# Patient Record
Sex: Female | Born: 1960 | Race: Black or African American | Hispanic: No | Marital: Married | Smoking: Never smoker
Health system: Southern US, Community
[De-identification: ages and names within clinical notes are randomized; demographics above are authoritative.]

## PROBLEM LIST (undated history)

## (undated) DIAGNOSIS — F419 Anxiety disorder, unspecified: Secondary | ICD-10-CM

## (undated) DIAGNOSIS — E119 Type 2 diabetes mellitus without complications: Secondary | ICD-10-CM

## (undated) DIAGNOSIS — I10 Essential (primary) hypertension: Secondary | ICD-10-CM

## (undated) DIAGNOSIS — T7840XA Allergy, unspecified, initial encounter: Secondary | ICD-10-CM

## (undated) HISTORY — DX: Allergy, unspecified, initial encounter: T78.40XA

## (undated) HISTORY — DX: Essential (primary) hypertension: I10

## (undated) HISTORY — PX: TUBAL LIGATION: SHX77

---

## 2004-04-18 ENCOUNTER — Other Ambulatory Visit: Admission: RE | Admit: 2004-04-18 | Discharge: 2004-04-18 | Payer: Self-pay | Admitting: Family Medicine

## 2004-11-19 ENCOUNTER — Encounter (INDEPENDENT_AMBULATORY_CARE_PROVIDER_SITE_OTHER): Payer: Self-pay | Admitting: Specialist

## 2004-11-19 ENCOUNTER — Ambulatory Visit (HOSPITAL_COMMUNITY): Admission: RE | Admit: 2004-11-19 | Discharge: 2004-11-19 | Payer: Self-pay | Admitting: Obstetrics & Gynecology

## 2004-12-23 ENCOUNTER — Ambulatory Visit (HOSPITAL_COMMUNITY): Admission: RE | Admit: 2004-12-23 | Discharge: 2004-12-23 | Payer: Self-pay | Admitting: Obstetrics & Gynecology

## 2006-12-14 ENCOUNTER — Encounter: Admission: RE | Admit: 2006-12-14 | Discharge: 2006-12-14 | Payer: Self-pay | Admitting: Family Medicine

## 2007-10-24 ENCOUNTER — Emergency Department (HOSPITAL_COMMUNITY): Admission: EM | Admit: 2007-10-24 | Discharge: 2007-10-24 | Payer: Self-pay | Admitting: Emergency Medicine

## 2007-11-14 ENCOUNTER — Emergency Department (HOSPITAL_COMMUNITY): Admission: EM | Admit: 2007-11-14 | Discharge: 2007-11-14 | Payer: Self-pay | Admitting: Emergency Medicine

## 2007-11-29 ENCOUNTER — Encounter: Admission: RE | Admit: 2007-11-29 | Discharge: 2007-11-29 | Payer: Self-pay | Admitting: Family Medicine

## 2007-12-28 ENCOUNTER — Encounter: Admission: RE | Admit: 2007-12-28 | Discharge: 2007-12-28 | Payer: Self-pay | Admitting: Family Medicine

## 2011-01-04 ENCOUNTER — Emergency Department (HOSPITAL_COMMUNITY)
Admission: EM | Admit: 2011-01-04 | Discharge: 2011-01-04 | Payer: Self-pay | Source: Home / Self Care | Admitting: Emergency Medicine

## 2011-04-24 NOTE — Op Note (Signed)
NAMEALANNI, VADER                 ACCOUNT NO.:  [id number]   MEDICAL RECORD NO.:  [id number]          PATIENT TYPE:  AMB   LOCATION:  SDC                           FACILITY:  [facility]   PHYSICIAN:  Ilda Mori, M.D.   DATE OF BIRTH:  05-Aug-1961   DATE OF PROCEDURE:  12/23/2004  DATE OF DISCHARGE:                                 OPERATIVE REPORT   PREOPERATIVE DIAGNOSIS:  Voluntary sterilization.   POSTOPERATIVE DIAGNOSIS:  Voluntary sterilization, pelvic adhesions.   PROCEDURE:  Laparoscopic bilateral tubal cautery with sterilization.   SURGEON:  Ilda Mori, M.D.   ANESTHESIA:  General endotracheal.   ESTIMATED BLOOD LOSS:  Minimal.   FINDINGS:  There were thick adhesions from the fundus to the anterior  abdominal wall of the uterus.  There were omental adhesions around the  umbilical area and along the anterior abdominal wall.  The fallopian tubes  appeared normal.  The ovaries appeared normal.  The cul-de-sac was free of  endometriosis.   INDICATION:  This is a 50 year old gravida 4, para 3, AB 1, who requests  permanent sterilization.  Options were discussed with the patient including  IUD use and hormonal contraceptives.  The patient elected to proceed with  tubal sterilization.   PROCEDURE:  The patient was taken to the operating room and placed in the  supine position and general endotracheal anesthesia was induced.  She was  then placed in the modified dorsal lithotomy position.  The abdomen and  vagina and perineum were prepped and draped in a sterile fashion.  A Hulka  tenaculum was introduced through the endocervical canal and affixed to the  anterior lip of the cervix.  The surgeon re-gowned and gloved.  An incision  was made at the base of the umbilicus and a Veress needle was introduced  into the peritoneal cavity and pneumoperitoneum was created.  A 5-mm trocar  was introduced into the pneumoperitoneum and under direct visualization, an  accessory probe was  placed slightly right of midline in the suprapubic area  avoiding a uterine adhesion which went to the anterior abdominal wall.  The  adnexa were viewed with some difficulty due to omental adhesions and pockets  through which the scope had to be placed in order to visualize the adnexal  structures.  Finally they were both clearly visualized.  The fimbriated ends  were noted and the ovaries were noted to be present and normal bilaterally.  A Kleppinger forceps was introduced and grasped the left fallopian tube.  This tube was cauterized along a 3-4 cm length at the infundibular junction  until no current was flowing through the tissue.  An identical procedure was  then carried out on the contralateral tube.  At the end of the procedure,  both tubes were  cauterized along a 4-cm length leaving a centimeter of normal tube proximal  to the burn site.  The procedure was then terminated.  The gas was allowed  to escape.  The incisions were closed with Dermabond.  The patient left the  operating room in good condition.  RK/MEDQ  D:  12/23/2004  T:  12/23/2004  Job:  [id number]

## 2011-04-24 NOTE — Op Note (Signed)
Bonnie Webster, Bonnie Webster                 ACCOUNT NO.:  [id number]   MEDICAL RECORD NO.:  [id number]          PATIENT TYPE:  AMB   LOCATION:  SDC                           FACILITY:  [facility]   PHYSICIAN:  Ilda Mori, M.D.   DATE OF BIRTH:  08-23-1961   DATE OF PROCEDURE:  11/19/2004  DATE OF DISCHARGE:                                 OPERATIVE REPORT   PREOPERATIVE DIAGNOSES:  Unplanned pregnancy, hypertension, advanced  maternal age.   POSTOPERATIVE DIAGNOSES:  Unplanned pregnancy, hypertension, advanced  maternal age.   PROCEDURE:  Therapeutic abortion, dilatation and evacuation.   SURGEON:  Ilda Mori, M.D.   ANESTHESIA:  Paracervical block with IV sedation.   ESTIMATED BLOOD LOSS:  20 mL.   FINDINGS:  Products of conception consistent with an [redacted] week gestation.   INDICATIONS FOR PROCEDURE:  This is a 50 year old, gravida 3, para 3 who  presented to the office with an unplanned pregnancy.  The patient requested  to know her options and the risk of carrying a pregnancy at 41 weeks with a  history of chronic hypertension and type 2 diabetes.  The risks were  discussed with the patient who elected to terminate the pregnancy.   DESCRIPTION OF PROCEDURE:  The patient was taken to the operating room and  IV sedation was administered. She was placed in the dorsal lithotomy  position and the perineum and vagina were prepped and draped in a sterile  fashion.  Evaluation under anesthesia revealed an anterior 10-12 week size  uterus.  The speculum was placed in the vagina, the anterior lip of the  cervix was grasped with a single tooth tenaculum, 20 mL of 1% lidocaine was  infiltrated in the paracervical tissues.  The internal os was dilated with  Shawnie Pons dilators to a #35 [place], a #12 suction curette was then introduced  into the endometrial cavity and the products of conception were aspirated.  Following this, a #9 suction curette was introduced and a suction D&C was  performed to  ensure that the entire uterine contents had been emptied. The  procedure was then terminated and the patient left the operating room in  good condition.      RK/MEDQ  D:  11/19/2004  T:  11/19/2004  Job:  [id number]

## 2011-09-14 LAB — DIFFERENTIAL
Basophils Absolute: 0.1
Eosinophils Absolute: 0 — ABNORMAL LOW
Eosinophils Relative: 0
Lymphocytes Relative: 18
Lymphs Abs: 1.8
Monocytes Absolute: 0.3
Monocytes Relative: 3
Neutrophils Relative %: 79 — ABNORMAL HIGH

## 2011-09-14 LAB — COMPREHENSIVE METABOLIC PANEL
BUN: 10
Calcium: 9.5
Glucose, Bld: 153 — ABNORMAL HIGH
Total Bilirubin: 1.2
Total Protein: 7

## 2011-09-14 LAB — CBC
HCT: 45.6
MCHC: 34
Platelets: 304
RDW: 13

## 2011-09-14 LAB — URINALYSIS, ROUTINE W REFLEX MICROSCOPIC
Protein, ur: NEGATIVE
Specific Gravity, Urine: 1.029
Urobilinogen, UA: 1
pH: 5.5

## 2013-05-05 ENCOUNTER — Ambulatory Visit: Payer: 59

## 2013-05-05 ENCOUNTER — Ambulatory Visit (INDEPENDENT_AMBULATORY_CARE_PROVIDER_SITE_OTHER): Payer: 59 | Admitting: Family Medicine

## 2013-05-05 VITALS — BP 130/90 | HR 61 | Temp 97.9°F | Resp 16 | Ht 67.0 in | Wt 228.0 lb

## 2013-05-05 DIAGNOSIS — M542 Cervicalgia: Secondary | ICD-10-CM

## 2013-05-05 DIAGNOSIS — S0990XA Unspecified injury of head, initial encounter: Secondary | ICD-10-CM

## 2013-05-05 DIAGNOSIS — S0993XA Unspecified injury of face, initial encounter: Secondary | ICD-10-CM
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
## 2013-05-05 NOTE — Patient Instructions (Signed)
Concussion and Brain Injury  A blow to the head can stop the brain from working normally (concussion). It is usually not life-threatening. However, the results of the injury can be serious. Problems caused by the injury might show up right away or days or weeks later. Getting better might take some time.  HOME CARE   Rest your body. Ways to rest your body include:   Getting plenty of sleep at night.   Going to sleep early.   Taking naps during the day when you feel tired.   Limit activities that require a lot of thought. This includes:   Time spent with homework.   Time spent with work related to a job.   TV watching.   Computer use.   Return to normal activities (driving, work, school) only when your doctor says it is okay.   Avoid high impact activity and sports until your doctor says it is okay.   Take medicines only as told by your doctor.   Do not drink alcohol until your doctor says it is okay.   Do not make important decisions without help until you feel better.   Follow up with your doctor as told.  GET HELP RIGHT AWAY IF:   You, your family, or your friends notice that:   You have bad headaches, or they get worse.   You have weakness, loss of feeling (numbness), or you feel off balance.   You keep throwing up (vomiting).   You feel tired or pass out (faint).   One black center of your eye (pupil) is larger than the other.   You twitch or shake (seize).   Your speech is not clear (slurred).   You are confused, restless, easily angered (agitated), or annoyed (irritable).   You cannot recognize or respond to people or activities.   You have neck pain.   You have trouble being woken up.   Your behavior changes.  MAKE SURE YOU:    Understand these instructions.   Will watch your condition.   Will get help right away if you are not doing well or get worse.  Document Released: 11/11/2009 Document Revised: 02/15/2012 Document Reviewed: 11/11/2009  Exit[employer] Patient Information 2014  [employer].
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
## 2015-06-29 ENCOUNTER — Encounter (HOSPITAL_COMMUNITY): Payer: Self-pay | Admitting: Nurse Practitioner

## 2015-06-29 ENCOUNTER — Emergency Department (HOSPITAL_COMMUNITY)
Admission: EM | Admit: 2015-06-29 | Discharge: 2015-06-29 | Disposition: A | Discharge status: Home / Self Care | Payer: BLUE CROSS/BLUE SHIELD | Attending: Emergency Medicine | Admitting: Emergency Medicine

## 2015-06-29 DIAGNOSIS — Y939 Activity, unspecified: Secondary | ICD-10-CM | POA: Diagnosis not present

## 2015-06-29 DIAGNOSIS — X58XXXA Exposure to other specified factors, initial encounter: Secondary | ICD-10-CM | POA: Diagnosis not present

## 2015-06-29 DIAGNOSIS — Z79899 Other long term (current) drug therapy: Secondary | ICD-10-CM | POA: Insufficient documentation

## 2015-06-29 DIAGNOSIS — I10 Essential (primary) hypertension: Secondary | ICD-10-CM | POA: Insufficient documentation

## 2015-06-29 DIAGNOSIS — Y929 Unspecified place or not applicable: Secondary | ICD-10-CM | POA: Insufficient documentation

## 2015-06-29 DIAGNOSIS — Y998 Other external cause status: Secondary | ICD-10-CM | POA: Insufficient documentation

## 2015-06-29 DIAGNOSIS — T1592XA Foreign body on external eye, part unspecified, left eye, initial encounter: Secondary | ICD-10-CM | POA: Insufficient documentation

## 2015-06-29 MED ORDER — TETRACAINE HCL 0.5 % OP SOLN
2.0000 [drp] | Freq: Once | OPHTHALMIC | Status: AC
  Administered 2015-06-29: 2 [drp] via OPHTHALMIC
  Filled 2015-06-29: qty 2

## 2015-06-29 MED ORDER — FLUORESCEIN SODIUM 1 MG OP STRP
1.0000 | ORAL_STRIP | Freq: Once | OPHTHALMIC | Status: AC
  Administered 2015-06-29: 1 via OPHTHALMIC
  Filled 2015-06-29: qty 1
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
## 2015-06-29 NOTE — ED Notes (Signed)
Pt is in stable condition upon d/c and ambulates from ED. 

## 2015-06-29 NOTE — ED Provider Notes (Signed)
CSN: [id number]     Arrival date & time 06/29/15  1240 History  This chart was scribed for non-physician practitioner, Marlon Pel, PA-C, working with Gwyneth Sprout, MD, by Ronney Lion, ED Scribe. This patient was seen in room TR04C/TR04C and the patient's care was started at 1:04 PM.    Chief Complaint  Patient presents with  . Eye Problem   The history is provided by the patient. No language interpreter was used.    HPI Comments: Bonnie Webster is a 54 y.o. female who presents to the Emergency Department complaining of left eye redness and irritation that began 1 hour ago, which she suspects may be a contact lens or part of a contact lens stuck in her eye. Patient states she uses daily lenses and had taken both contact lenses out of her eyes last. She then put in both contacts this morning but noticed the vision in her left eye was still blurry, so she thought the lens had popped out onto the floor. She then took both contact lenses out and wore glasses for the rest of the day, when 1 hour ago she began to feel sudden onset irritation.    Past Medical History  Diagnosis Date  . Allergy   . Hypertension    Past Surgical History  Procedure Laterality Date  . Cesarean section    . Tubal ligation     History reviewed. No pertinent family history. History  Substance Use Topics  . Smoking status: Never Smoker   . Smokeless tobacco: Not on file  . Alcohol Use: No   OB History    No data available     Review of Systems  Eyes: Positive for pain and redness.  A complete 10 system review of systems was obtained and all systems are negative except as noted in the HPI and PMH.    Allergies  Vicodin  Home Medications   Prior to Admission medications   Medication Sig Start Date End Date Taking? Authorizing Provider  atenolol-chlorthalidone (TENORETIC) 50-25 MG per tablet Take 1 tablet by mouth daily.    Historical Provider, MD  olmesartan (BENICAR) 20 MG tablet Take 20 mg by mouth  daily.    Historical Provider, MD   BP 146/102 mmHg  Pulse 89  Temp(Src) 98 F (36.7 C) (Oral)  Ht  (1.676 m)  Wt 205 lb (92.987 kg)  BMI 33.10 kg/m2  SpO2 100% Physical Exam  Constitutional: She is oriented to person, place, and time. She appears well-developed and well-nourished. No distress.  HENT:  Head: Normocephalic and atraumatic.  Eyes: EOM are normal. Pupils are equal, round, and reactive to light. Foreign body present in the left eye. Left conjunctiva is injected. Left conjunctiva has no hemorrhage.    Neck: Neck supple. No tracheal deviation present.  Cardiovascular: Normal rate.   Pulmonary/Chest: Effort normal. No respiratory distress.  Musculoskeletal: Normal range of motion.  Neurological: She is alert and oriented to person, place, and time.  Skin: Skin is warm and dry.  Psychiatric: She has a normal mood and affect. Her behavior is normal.  Nursing note and vitals reviewed.   ED Course  Procedures (including critical care time)  DIAGNOSTIC STUDIES: Oxygen Saturation is 100% on RA, normal by my interpretation.    COORDINATION OF CARE: 1:13 PM - Fluorescein exam shows almost-intact contact lens on patient's left eye, which was removed by me without difficulty.   MDM   Final diagnoses:  Foreign body, eye, left, initial encounter  No corneal abrasion on slit lamp exam. Contact removed Pt is going to her eye doctor today to discuss her concerns with her daily contacts.  Medications  tetracaine (PONTOCAINE) 0.5 % ophthalmic solution 2 drop (2 drops Left Eye Given 06/29/15 1301)  fluorescein ophthalmic strip 1 strip (1 strip Left Eye Given 06/29/15 1301)    53 y.o.Bonnie Webster's evaluation in the Emergency Department is complete. It has been determined that no acute conditions requiring further emergency intervention are present at this time. The patient/guardian have been advised of the diagnosis and plan. We have discussed signs and symptoms that  warrant return to the ED, such as changes or worsening in symptoms.  Vital signs are stable at discharge. Filed Vitals:   06/29/15 1253  BP: 146/102  Pulse: 89  Temp: 98 F (36.7 C)    Patient/guardian has voiced understanding and agreed to follow-up with the PCP or specialist.   I personally performed the services described in this documentation, which was scribed in my presence. The recorded information has been reviewed and is accurate.     Marlon Pel, PA-C 06/29/15 1320  Gwyneth Sprout, MD 06/29/15 1624

## 2015-06-29 NOTE — Discharge Instructions (Signed)
Eye, Foreign Body The term foreign body refers to any object near, on the surface of or in the eye that should not be there. A foreign body may be a small speck of dirt or dust, a hair or eyelash, a splinter or any object. CAUSES  Foreign bodies can get in the eye by:  Flying pieces of something that was broken or destroyed (debris).  A sudden injury (trauma) to the eye. SYMPTOMS  Symptoms depend on what the foreign body is and where it is in the eye. The most common locations are:  On the inner surface of the upper or lower eyelids or on the covering of the white part of the eye (conjunctiva). Symptoms in this location are:  Irritating and painful, especially when blinking.  Feeling like something is in the eye.  On the surface of the clear covering on the front of the eye (cornea). A corneal foreign body has symptoms that:  Are painful and irritating since the cornea is very sensitive.  Form small "rust rings" around a metallic foreign body. Metallic foreign bodies stick more firmly to the surface of the cornea.  Inside the eyeball. Infection can happen fast and can be hard to treat with antibiotics. This is an extremely dangerous situation. Foreign bodies inside the eye can threaten vision. A person may even loose their eye. Foreign bodies inside the eye may cause:  Great pain.  Immediate loss of vision. DIAGNOSIS  Foreign bodies are found during an exam by an eye specialist. Those that are on the eyelids, conjunctiva or cornea are usually (but not always) easily found. When a foreign body is inside the eyeball, a cataract may form almost right away. This makes it hard for an ophthalmologist to find the foreign body. Special tests may be needed, including ultrasound testing, X-rays and CT scans. TREATMENT   Foreign bodies that are on the eyelids, conjunctiva or cornea are often removed easily and painlessly.  If the foreign body has caused a scratch or abrasion of the cornea,  antibiotic drops, ointments and/or a tight patch called a "pressure patch" may be needed. Follow-up exams will be needed for several days until the abrasion heals.  Surgery is needed right away if the foreign body is inside the eyeball. This is a medical emergency. An antibiotic therapy will likely be given to stop an infection. HOME CARE INSTRUCTIONS  The use of eye patches is not universal. Their use varies from state to state and from caregiver to caregiver. If an eye patch was applied:  Keep the eye patch on for as long as directed by your caregiver until the follow-up appointment.  Do not remove the patch to put in medications unless instructed to do so. When replacing the patch, retape it as it was before. Follow the same procedure if the patch becomes loose.  WARNING: Do not drive or operate machinery while the eye is patched. The ability to judge distances will be impaired.  Only take over-the-counter or prescription medicines for pain, discomfort or fever as directed by the caregiver. If no eye patch was applied:  Keep the eye closed as much as possible. Do not rub the eye.  Wear dark glasses as needed to protect the eyes from bright light.  Do not wear contact lenses until the eye feels normal again, or as instructed.  Wear protective eye covering if there is a risk of eye injury. This is important when working with high speed tools.  Only take over-the-counter or   prescription medicines for pain, discomfort or fever as directed by the caregiver. SEEK IMMEDIATE MEDICAL CARE IF:   Pain increases in the eye or the vision changes.  You or your child has problems with the eye patch.  The injury to the eye appears to be getting larger.  There is discharge from the injured eye.  Swelling and/or soreness (inflammation) develops around the affected eye.  You or your child has an oral temperature above 102 F (38.9 C), not controlled by medicine.  Your baby is older than 3  months with a rectal temperature of 102 F (38.9 C) or higher.  Your baby is 3 months old or younger with a rectal temperature of 100.4 F (38 C) or higher. MAKE SURE YOU:   Understand these instructions.  Will watch your condition.  Will get help right away if you are not doing well or get worse. Document Released: 11/23/2005 Document Revised: 02/15/2012 Document Reviewed: 04/20/2013 Exit[employer] Patient Information 2015 [employer]. This information is not intended to replace advice given to you by your health care provider. Make sure you discuss any questions you have with your health care provider.  

## 2016-04-02 ENCOUNTER — Other Ambulatory Visit: Payer: Self-pay | Admitting: Obstetrics & Gynecology

## 2016-04-03 LAB — CYTOLOGY - PAP

## 2024-09-26 ENCOUNTER — Other Ambulatory Visit: Payer: Self-pay

## 2024-09-26 ENCOUNTER — Emergency Department (HOSPITAL_COMMUNITY): Payer: Self-pay

## 2024-09-26 ENCOUNTER — Emergency Department (HOSPITAL_COMMUNITY)
Admission: EM | Admit: 2024-09-26 | Discharge: 2024-09-26 | Disposition: A | Discharge status: Home / Self Care | Payer: Self-pay | Attending: Emergency Medicine | Admitting: Emergency Medicine

## 2024-09-26 ENCOUNTER — Encounter (HOSPITAL_COMMUNITY): Payer: Self-pay

## 2024-09-26 DIAGNOSIS — E119 Type 2 diabetes mellitus without complications: Secondary | ICD-10-CM | POA: Insufficient documentation

## 2024-09-26 DIAGNOSIS — R7989 Other specified abnormal findings of blood chemistry: Secondary | ICD-10-CM | POA: Insufficient documentation

## 2024-09-26 DIAGNOSIS — Z79899 Other long term (current) drug therapy: Secondary | ICD-10-CM | POA: Insufficient documentation

## 2024-09-26 DIAGNOSIS — N200 Calculus of kidney: Secondary | ICD-10-CM

## 2024-09-26 DIAGNOSIS — I10 Essential (primary) hypertension: Secondary | ICD-10-CM | POA: Insufficient documentation

## 2024-09-26 DIAGNOSIS — N130 Hydronephrosis with ureteropelvic junction obstruction: Secondary | ICD-10-CM | POA: Insufficient documentation

## 2024-09-26 HISTORY — DX: Type 2 diabetes mellitus without complications: E11.9

## 2024-09-26 HISTORY — DX: Anxiety disorder, unspecified: F41.9

## 2024-09-26 LAB — BASIC METABOLIC PANEL WITH GFR
Anion gap: 12 (ref 5–15)
BUN: 14 mg/dL (ref 8–23)
CO2: 23 mmol/L (ref 22–32)
Calcium: 9.7 mg/dL (ref 8.9–10.3)
Chloride: 106 mmol/L (ref 98–111)
Creatinine, Ser: 1.01 mg/dL — ABNORMAL HIGH (ref 0.44–1.00)
GFR, Estimated: >60 mL/min (ref >60)
Glucose, Bld: 117 mg/dL — ABNORMAL HIGH (ref 70–99)
Potassium: 3.7 mmol/L (ref 3.5–5.1)
Sodium: 142 mmol/L (ref 135–145)

## 2024-09-26 LAB — HEPATIC FUNCTION PANEL
ALT: 14 U/L (ref 0–44)
AST: 23 U/L (ref 15–41)
Albumin: 4.2 g/dL (ref 3.5–5.0)
Alkaline Phosphatase: 98 U/L (ref 38–126)
Bilirubin, Direct: 0.6 mg/dL — ABNORMAL HIGH (ref 0.0–0.2)
Indirect Bilirubin: 0.7 mg/dL (ref 0.3–0.9)
Total Bilirubin: 1.3 mg/dL — ABNORMAL HIGH (ref 0.0–1.2)
Total Protein: 7.3 g/dL (ref 6.5–8.1)

## 2024-09-26 LAB — URINALYSIS, ROUTINE W REFLEX MICROSCOPIC
Bilirubin Urine: NEGATIVE
Glucose, UA: NEGATIVE mg/dL
Ketones, ur: 5 mg/dL — AB
Leukocytes,Ua: NEGATIVE
Nitrite: NEGATIVE
Protein, ur: NEGATIVE mg/dL
Specific Gravity, Urine: 1.012 (ref 1.005–1.030)
pH: 8 (ref 5.0–8.0)

## 2024-09-26 LAB — CBC
HCT: 39.8 % (ref 36.0–46.0)
Hemoglobin: 13.5 g/dL (ref 12.0–15.0)
MCH: 30.1 pg (ref 26.0–34.0)
MCHC: 33.9 g/dL (ref 30.0–36.0)
MCV: 88.8 fL (ref 80.0–100.0)
Platelets: 182 K/uL (ref 150–400)
RBC: 4.48 MIL/uL (ref 3.87–5.11)
RDW: 12 % (ref 11.5–15.5)
WBC: 6.8 K/uL (ref 4.0–10.5)
nRBC: 0 % (ref 0.0–0.2)

## 2024-09-26 LAB — LIPASE, BLOOD: Lipase: 21 U/L (ref 11–51)

## 2024-09-26 MED ORDER — TRAMADOL HCL 50 MG PO TABS: 50.0000 mg | ORAL_TABLET | Freq: Four times a day (QID) | ORAL | 0 refills | Status: AC | PRN

## 2024-09-26 MED ORDER — TAMSULOSIN HCL 0.4 MG PO CAPS: 0.4000 mg | ORAL_CAPSULE | Freq: Every day | ORAL | 0 refills | Status: AC

## 2024-09-26 MED ORDER — NAPROXEN 500 MG PO TABS
500.0000 mg | ORAL_TABLET | Freq: Two times a day (BID) | ORAL | 0 refills | Status: AC
Start: 2024-09-26 — End: ?

## 2024-09-26 MED ORDER — SODIUM CHLORIDE (PF) 0.9 % IJ SOLN
INTRAMUSCULAR | Status: AC
  Filled 2024-09-26: qty 50

## 2024-09-26 MED ORDER — IOHEXOL 300 MG/ML  SOLN
100.0000 mL | Freq: Once | INTRAMUSCULAR | Status: AC | PRN
  Administered 2024-09-26: 100 mL via INTRAVENOUS

## 2024-09-26 MED ORDER — ONDANSETRON HCL 4 MG PO TABS
4.0000 mg | ORAL_TABLET | Freq: Four times a day (QID) | ORAL | 0 refills | Status: AC
Start: 2024-09-26 — End: ?

## 2024-09-26 NOTE — ED Triage Notes (Signed)
 Patient has left sided flank pain since 5am today. Has vomited 3 times. Has had dark urine. No blood in urine.

## 2024-09-26 NOTE — ED Provider Notes (Signed)
 [place] EMERGENCY DEPARTMENT AT [facility] Provider Note   CSN: [id number] Arrival date & time: 09/26/24  9155     Patient presents with: Flank Pain   Bonnie Webster is a 63 y.o. female.  Flank Pain  Pt is a 63 y/o female presenting to the ED for L sided flank pain accompanied with n/v, chills, fatigue starting this AM and dark urine and urinary urgency starting yesterday. 1 episode of Emesis, nonbloody, nonbilous, noted to be water she had drank earlier. Taking ozempic x 2 years without difficulty.   Hx of DM, HTN.  Has reported doing some home renovations with heavy lifting this past week.   LMP 2012  Denies alcohol, drug use.  Denies fever, headache, vision changes, cough, congestion, chest pain, shortness of breath, dysuria, vaginal discharge, vaginal bleeding, LLE.      Prior to Admission medications   Medication Sig Start Date End Date Taking? Authorizing Provider  naproxen (NAPROSYN) 500 MG tablet Take 1 tablet (500 mg total) by mouth 2 (two) times daily. 09/26/24  Yes Beola Derry S, PA-C  ondansetron (ZOFRAN) 4 MG tablet Take 1 tablet (4 mg total) by mouth every 6 (six) hours. 09/26/24  Yes Beola Derry RAMAN, PA-C  tamsulosin (FLOMAX) 0.4 MG CAPS capsule Take 1 capsule (0.4 mg total) by mouth daily after supper. 09/26/24  Yes Beola Derry RAMAN, PA-C  traMADol (ULTRAM) 50 MG tablet Take 1 tablet (50 mg total) by mouth every 6 (six) hours as needed. 09/26/24  Yes Beola Derry RAMAN, PA-C  atenolol-chlorthalidone (TENORETIC) 50-25 MG per tablet Take 1 tablet by mouth daily.    [provider]  olmesartan (BENICAR) 20 MG tablet Take 20 mg by mouth daily.    [provider]    Allergies: Vicodin [hydrocodone-acetaminophen]    Review of Systems  Genitourinary:  Positive for flank pain.  All other systems reviewed and are negative.   Updated Vital Signs BP (!) 155/107   Pulse 78   Temp 98.1 F (36.7 C) (Oral)   Resp 19   Ht 5' 6 (1.676  m)   Wt 78.9 kg   SpO2 100%   BMI 28.08 kg/m   Physical Exam Vitals and nursing note reviewed.  Constitutional:      General: She is not in acute distress.    Appearance: Normal appearance. She is not ill-appearing or diaphoretic.  HENT:     Head: Normocephalic and atraumatic.  Eyes:     General: No scleral icterus.       Right eye: No discharge.        Left eye: No discharge.     Extraocular Movements: Extraocular movements intact.     Conjunctiva/sclera: Conjunctivae normal.  Cardiovascular:     Rate and Rhythm: Normal rate and regular rhythm.     Pulses: Normal pulses.     Heart sounds: Normal heart sounds. No murmur heard.    No friction rub. No gallop.  Pulmonary:     Effort: Pulmonary effort is normal. No respiratory distress.     Breath sounds: No stridor. No wheezing, rhonchi or rales.  Chest:     Chest wall: No tenderness.  Abdominal:     General: Abdomen is flat. There is no distension.     Palpations: Abdomen is soft.     Tenderness: There is no abdominal tenderness. There is no right CVA tenderness, left CVA tenderness, guarding or rebound.  Musculoskeletal:        General: No  swelling, deformity or signs of injury.     Cervical back: Normal range of motion. No rigidity.     Right lower leg: No edema.     Left lower leg: No edema.  Skin:    General: Skin is warm and dry.     Findings: No bruising, erythema or lesion.  Neurological:     General: No focal deficit present.     Mental Status: She is alert and oriented to person, place, and time. Mental status is at baseline.     Sensory: No sensory deficit.     Motor: No weakness.  Psychiatric:        Mood and Affect: Mood normal.     (all labs ordered are listed, but only abnormal results are displayed) Labs Reviewed  URINALYSIS, ROUTINE W REFLEX MICROSCOPIC - Abnormal; Notable for the following components:      Result Value   Color, Urine STRAW (*)    Hgb urine dipstick SMALL (*)    Ketones, ur 5 (*)     Bacteria, UA RARE (*)    All other components within normal limits  BASIC METABOLIC PANEL WITH GFR - Abnormal; Notable for the following components:   Glucose, Bld 117 (*)    Creatinine, Ser 1.01 (*)    All other components within normal limits  HEPATIC FUNCTION PANEL - Abnormal; Notable for the following components:   Total Bilirubin 1.3 (*)    Bilirubin, Direct 0.6 (*)    All other components within normal limits  URINE CULTURE  CBC  LIPASE, BLOOD    EKG: None  Radiology: CT ABDOMEN PELVIS W CONTRAST Result Date: 09/26/2024 CLINICAL DATA:  Abdominal/flank pain in left lower quadrant. Possible renal stone. Vomiting. EXAM: CT ABDOMEN AND PELVIS WITH CONTRAST TECHNIQUE: Multidetector CT imaging of the abdomen and pelvis was performed using the standard protocol following bolus administration of intravenous contrast. RADIATION DOSE REDUCTION: This exam was performed according to the departmental dose-optimization program which includes automated exposure control, adjustment of the mA and/or kV according to patient size and/or use of iterative reconstruction technique. CONTRAST:  100mL OMNIPAQUE IOHEXOL 300 MG/ML  SOLN COMPARISON:  None Available. FINDINGS: Lower chest: Heart is normal size.  Visualized lung bases are clear. Hepatobiliary: Liver, gallbladder and biliary tree are normal. Pancreas: Normal. Spleen: Normal. Adrenals/Urinary Tract: Adrenal glands are normal. Kidneys are normal in size. No evidence of renal stones. Mild dilatation of the left intrarenal collecting system and ureter. Mild stranding of the left perinephric fat. There is a 2 mm stone over the left UVJ causing this low-grade obstruction. Right ureter is normal. Bladder is unremarkable. Stomach/Bowel: Stomach and small bowel are normal. Appendix is normal. Colon is decompressed distal to the hepatic flexure. Vascular/Lymphatic: Abdominal aorta is normal in caliber. Remaining vascular structures are unremarkable. No  adenopathy. Reproductive: Uterus and bilateral adnexa are unremarkable. Other: No significant free fluid. Musculoskeletal: No focal abnormality. Minimal degenerative change of the spine and hips. IMPRESSION: 2 mm stone over the left UVJ causing low-grade obstruction. Electronically Signed   By: Toribio Agreste M.D.   On: 09/26/2024 14:54    Procedures   Medications Ordered in the ED  iohexol (OMNIPAQUE) 300 MG/ML solution 100 mL (100 mLs Intravenous Contrast Given 09/26/24 1253)   Medical Decision Making Amount and/or Complexity of Data Reviewed Labs: ordered. Radiology: ordered.  Risk Prescription drug management.  This patient is a 62 year old female who presents to the ED for concern of left-sided flank/side pain that had acute  onset this morning.  Noted to also be reporting urinary frequency, 1 episode of emesis, noted that she did drink a lot of water previously and that the emesis was watery.  Currently symptoms have greatly improved.  On physical exam, patient is in no acute distress, afebrile, alert and orient x 4, speaking in full sentences, nontachypneic, nontachycardic.  LCTAB, RRR, no murmur, no CVA tenderness.  Noted to have some mild left-sided abdominal tenderness to palpation.  Exam otherwise unremarkable.  Lab work does show a mildly elevated creatinine 1.01 as well as nonspecific UA.  Noting nonspecific 1.3 bilirubin.  With lipase unremarkable.  CT scan shows 2 mm stones with low-grade obstruction.  With urine not infected, will send out for culture due to having rare bacteria.  Will treat if positive.  As well as sent home with pain control and nausea medications.  Patient vital signs have remained stable throughout the course of patient's time in the ED. Low suspicion for any other emergent pathology at this time. I believe this patient is safe to be discharged. Provided strict return to ER precautions. Patient expressed agreement and understanding of plan. All questions were  answered.  Differential diagnoses prior to evaluation: The emergent differential diagnosis includes, but is not limited to,  AAA, renal vascular thrombosis, mesenteric ischemia, pyelonephritis, nephrolithiasis, cystitis, biliary colic, pancreatitis, PUD, appendicitis, diverticulitis, bowel obstruction, PID/TOA, Ovarian cyst, Ovarian torsion, muscle strain. This is not an exhaustive differential.   Past Medical History / Co-morbidities / Social History: HTN, diabetes  Additional history: Chart reviewed. Pertinent results include:   Last seen in the ED in 2016.  Lab Tests/Imaging studies: I personally interpreted labs/imaging and the pertinent results include:    CBC unremarkable CMP notes a mildly elevated creatinine 1.01 otherwise unremarkable Hepatic function panel shows a nonspecific elevated bilirubin of 1.3 but otherwise unremarkable UA notes bacteria and ketones. Lipase unremarkable CT scan showed 2 mm stone Causing low-grade obstruction to left UVJ  I agree with the radiologist interpretation.    Medications: I ordered medication including tramadol, naproxen, Zofran, Flomax.  I have reviewed the patients home medicines and have made adjustments as needed.  Critical Interventions: None  Social Determinants of Health: none  Disposition: After consideration of the diagnostic results and the patients response to treatment, I feel that the patient would benefit from discharge return as above.   emergency department workup does not suggest an emergent condition requiring admission or immediate intervention beyond what has been performed at this time. The plan is: Follow-up with urology as needed, return to the ED for new or worsening symptoms, symptomatic management at home. The patient is safe for discharge and has been instructed to return immediately for worsening symptoms, change in symptoms or any other concerns.   Final diagnoses:  Kidney stone    ED Discharge Orders           Ordered    tamsulosin (FLOMAX) 0.4 MG CAPS capsule  Daily after supper        09/26/24 1511    naproxen (NAPROSYN) 500 MG tablet  2 times daily        09/26/24 1511    ondansetron (ZOFRAN) 4 MG tablet  Every 6 hours        09/26/24 1511    traMADol (ULTRAM) 50 MG tablet  Every 6 hours PRN        09/26/24 1511               Joyel Chenette S,  PA-C 09/26/24 1514    Dasie Faden, MD 09/28/24 0700

## 2024-09-26 NOTE — Discharge Instructions (Addendum)
 You were seen today for a kidney stone.  Noted to be approximately 2 mm on your left side.  I am prescribing some pain medications as well as nausea and medication to help with passing the stone.  This will likely pass on its own without needing further intervention otherwise.  Urine culture is pending, they will reach out to you if positive for any infection.  Please take Naprosyn, 500mg  by mouth twice daily as needed for pain - this in an antiinflammatory medicine (NSAID) and is similar to ibuprofen - many people feel that it is stronger than ibuprofen and it is easier to take since it is a smaller pill.  Please use this only for 1 week - if your pain persists, you will need to follow up with your doctor in the office for ongoing guidance and pain control.    Additionally you can use Tylenol as needed.  Take Tylenol (acetominophen)  650mg  every 4-6 hours, as needed for pain or fever. Do not take more than 4,000 mg in a 24-hour period. As this may cause liver damage. While this is rare, if you begin to develop yellowing of the skin or eyes, stop taking and return to ER immediately.  Sending in a short course of narcotic pain medications, please take these only instances of extreme pain when naproxen and Tylenol are not helping.  As this can cause risk for addiction, overdose, bad dreams, constipation.   I have attached urology's information to this after visit summary for you to schedule an appointment as needed if symptoms do not resolve.  Return to the ED though if concerning new or worsening symptoms which include fever, persistent vomiting, uncontrolled pain.

## 2024-09-27 LAB — URINE CULTURE

## 2024-09-29 ENCOUNTER — Other Ambulatory Visit: Payer: Self-pay

## 2024-09-29 ENCOUNTER — Encounter (HOSPITAL_COMMUNITY): Payer: Self-pay | Admitting: Emergency Medicine

## 2024-09-29 ENCOUNTER — Emergency Department (HOSPITAL_COMMUNITY)
Admission: EM | Admit: 2024-09-29 | Discharge: 2024-09-29 | Disposition: A | Discharge status: Home / Self Care | Payer: Self-pay

## 2024-09-29 DIAGNOSIS — N200 Calculus of kidney: Secondary | ICD-10-CM | POA: Insufficient documentation

## 2024-09-29 MED ORDER — KETOROLAC TROMETHAMINE 15 MG/ML IJ SOLN
15.0000 mg | Freq: Once | INTRAMUSCULAR | Status: AC
  Administered 2024-09-29: 15 mg via INTRAMUSCULAR
  Filled 2024-09-29: qty 1

## 2024-09-29 MED ORDER — HYDROMORPHONE HCL 1 MG/ML IJ SOLN
1.0000 mg | Freq: Once | INTRAMUSCULAR | Status: AC
Start: 2024-09-29 — End: 2024-09-29
  Administered 2024-09-29: 1 mg via INTRAMUSCULAR
  Filled 2024-09-29: qty 1

## 2024-09-29 MED ORDER — OXYCODONE HCL 5 MG PO TABS: 5.0000 mg | ORAL_TABLET | Freq: Four times a day (QID) | ORAL | 0 refills | Status: AC | PRN

## 2024-09-29 NOTE — Discharge Instructions (Signed)
 Follow-up with your urologist next week.  Take your oxycodone 4 times a day as needed for pain.  Continue your Flomax as prescribed.  Alternate Tylenol and Motrin every 3 hours throughout the day.  You can take 500 to 1000 mg of Tylenol at a time and a 600 to 800 mg of ibuprofen.

## 2024-09-29 NOTE — ED Triage Notes (Signed)
 PT reports left flank pain. Pt reports hydrocodone is not helping the pain. Pt was dx with kidney stone on Tuesday.

## 2024-09-29 NOTE — ED Provider Notes (Signed)
 [place] EMERGENCY DEPARTMENT AT [facility] Provider Note   CSN: [id number] Arrival date & time: 09/29/24  8279     Patient presents with: Flank Pain   Bonnie Webster is a 63 y.o. female.   63 year old female presents for evaluation of flank pain.  Was diagnosed with a kidney stone 2 days ago.  States she initially had some improvement in her pain but got worse again today.  States the pain seems like it is moving out in her lower abdomen.  She does not have any pain with urination.  She denies any other symptoms or concerns at this time.  States that Norco is not helping.   Flank Pain Pertinent negatives include no chest pain, no abdominal pain and no shortness of breath.       Prior to Admission medications   Medication Sig Start Date End Date Taking? Authorizing Provider  oxyCODONE (ROXICODONE) 5 MG immediate release tablet Take 1 tablet (5 mg total) by mouth every 6 (six) hours as needed for up to 4 days for severe pain (pain score 7-10). 09/29/24 10/03/24 Yes Loukas Antonson L, DO  atenolol-chlorthalidone (TENORETIC) 50-25 MG per tablet Take 1 tablet by mouth daily.    [provider]  naproxen (NAPROSYN) 500 MG tablet Take 1 tablet (500 mg total) by mouth 2 (two) times daily. 09/26/24   Beola Terrall RAMAN, PA-C  olmesartan (BENICAR) 20 MG tablet Take 20 mg by mouth daily.    [provider]  ondansetron (ZOFRAN) 4 MG tablet Take 1 tablet (4 mg total) by mouth every 6 (six) hours. 09/26/24   Bauer, Collin S, PA-C  tamsulosin (FLOMAX) 0.4 MG CAPS capsule Take 1 capsule (0.4 mg total) by mouth daily after supper. 09/26/24   Bauer, Collin S, PA-C  traMADol (ULTRAM) 50 MG tablet Take 1 tablet (50 mg total) by mouth every 6 (six) hours as needed. 09/26/24   Bauer, Collin S, PA-C    Allergies: Vicodin [hydrocodone-acetaminophen]    Review of Systems  Constitutional:  Negative for chills and fever.  HENT:  Negative for ear pain and sore throat.   Eyes:   Negative for pain and visual disturbance.  Respiratory:  Negative for cough and shortness of breath.   Cardiovascular:  Negative for chest pain and palpitations.  Gastrointestinal:  Negative for abdominal pain and vomiting.  Genitourinary:  Positive for flank pain. Negative for dysuria and hematuria.  Musculoskeletal:  Negative for arthralgias and back pain.  Skin:  Negative for color change and rash.  Neurological:  Negative for seizures and syncope.  All other systems reviewed and are negative.   Updated Vital Signs BP (!) 181/104   Pulse 69   Temp 98.5 F (36.9 C) (Oral)   Resp 18   SpO2 100%   Physical Exam Vitals and nursing note reviewed.  Constitutional:      General: She is not in acute distress.    Appearance: Normal appearance. She is well-developed. She is not ill-appearing.  HENT:     Head: Normocephalic and atraumatic.  Eyes:     Conjunctiva/sclera: Conjunctivae normal.  Cardiovascular:     Rate and Rhythm: Normal rate and regular rhythm.     Heart sounds: No murmur heard. Pulmonary:     Effort: Pulmonary effort is normal. No respiratory distress.     Breath sounds: Normal breath sounds.  Abdominal:     Palpations: Abdomen is soft.     Tenderness: There is no abdominal tenderness.  Musculoskeletal:  General: No swelling.     Cervical back: Neck supple.  Skin:    General: Skin is warm and dry.     Capillary Refill: Capillary refill takes less than 2 seconds.  Neurological:     Mental Status: She is alert.  Psychiatric:        Mood and Affect: Mood normal.     (all labs ordered are listed, but only abnormal results are displayed) Labs Reviewed - No data to display  EKG: None  Radiology: No results found.   Procedures   Medications Ordered in the ED  HYDROmorphone (DILAUDID) injection 1 mg (has no administration in time range)  ketorolac (TORADOL) 15 MG/ML injection 15 mg (has no administration in time range)                                     Medical Decision Making Patient here for ongoing kidney stone..  She overall appears well.  She had a 2 mm stone on her CT the other day.  I will give her Toradol and Dilaudid here.  Advised Tylenol Motrin alternating every 3 hours for pain and will tell her to stop taking the Norco and start taking oxycodone 5 mg.  She is already taking Flomax.  Advise close follow-up with urology and she plans to call and follow-up with them on Monday.  She is agreeable with the plan for discharge home.  Advised to return for new or worsening symptoms.  Problems Addressed: Kidney stone: acute illness or injury  Amount and/or Complexity of Data Reviewed External Data Reviewed: notes.    Details: Prior ED records reviewed and CT reviewed and patient is a 2 mm kidney stone that was found 2 days ago  Risk OTC drugs. Prescription drug management. Parenteral controlled substances.    Final diagnoses:  Kidney stone    ED Discharge Orders          Ordered    oxyCODONE (ROXICODONE) 5 MG immediate release tablet  Every 6 hours PRN        09/29/24 1816               Rudolph Dobler, Duwaine L, DO 09/29/24 1820
